# Patient Record
Sex: Male | Born: 1950 | Race: White | Hispanic: No | Marital: Married | Smoking: Former smoker
Health system: Southern US, Community
[De-identification: ages and names within clinical notes are randomized; demographics above are authoritative.]

## PROBLEM LIST (undated history)

## (undated) DIAGNOSIS — G4730 Sleep apnea, unspecified: Secondary | ICD-10-CM

## (undated) DIAGNOSIS — I10 Essential (primary) hypertension: Secondary | ICD-10-CM

## (undated) DIAGNOSIS — M5430 Sciatica, unspecified side: Secondary | ICD-10-CM

## (undated) DIAGNOSIS — I509 Heart failure, unspecified: Secondary | ICD-10-CM

## (undated) DIAGNOSIS — J811 Chronic pulmonary edema: Secondary | ICD-10-CM

## (undated) HISTORY — PX: REPLACEMENT TOTAL KNEE BILATERAL: SUR1225

## (undated) HISTORY — PX: CERVICAL LAMINECTOMY: SHX94

## (undated) HISTORY — PX: ORIF RADIUS & ULNA FRACTURES: SHX2129

## (undated) HISTORY — PX: CHOLECYSTECTOMY: SHX55

## (undated) HISTORY — PX: LUMBAR FUSION: SHX111

## (undated) HISTORY — PX: CORONARY ANGIOPLASTY WITH STENT PLACEMENT: SHX49

---

## 2010-07-21 ENCOUNTER — Encounter
Admission: RE | Admit: 2010-07-21 | Discharge: 2010-07-21 | Payer: Self-pay | Source: Home / Self Care | Attending: Neurosurgery | Admitting: Neurosurgery

## 2013-01-26 ENCOUNTER — Other Ambulatory Visit: Payer: Self-pay | Admitting: Specialist

## 2013-01-26 DIAGNOSIS — M545 Low back pain, unspecified: Secondary | ICD-10-CM

## 2013-02-01 NOTE — Progress Notes (Signed)
Blood obtained for labs for MR from right [facility] space without difficulty; site unremarkable.  Victory Dakin, RN

## 2013-02-03 ENCOUNTER — Ambulatory Visit
Admission: RE | Admit: 2013-02-03 | Discharge: 2013-02-03 | Disposition: A | Discharge status: Home / Self Care | Payer: 59 | Source: Ambulatory Visit | Attending: Specialist | Admitting: Specialist

## 2013-02-03 DIAGNOSIS — M545 Low back pain, unspecified: Secondary | ICD-10-CM

## 2013-02-03 MED ORDER — GADOBENATE DIMEGLUMINE 529 MG/ML IV SOLN
20.0000 mL | Freq: Once | INTRAVENOUS | Status: AC | PRN
  Administered 2013-02-03: 20 mL via INTRAVENOUS

## 2014-06-12 ENCOUNTER — Other Ambulatory Visit: Payer: Self-pay | Admitting: Specialist

## 2014-06-12 DIAGNOSIS — M5416 Radiculopathy, lumbar region: Secondary | ICD-10-CM

## 2014-06-13 ENCOUNTER — Ambulatory Visit
Admission: RE | Admit: 2014-06-13 | Discharge: 2014-06-13 | Disposition: A | Discharge status: Home / Self Care | Payer: 59 | Source: Ambulatory Visit | Attending: Specialist | Admitting: Specialist

## 2014-06-13 DIAGNOSIS — M5416 Radiculopathy, lumbar region: Secondary | ICD-10-CM

## 2014-06-13 MED ORDER — GADOBENATE DIMEGLUMINE 529 MG/ML IV SOLN
20.0000 mL | Freq: Once | INTRAVENOUS | Status: AC | PRN
  Administered 2014-06-13: 20 mL via INTRAVENOUS

## 2016-06-21 IMAGING — MR MR LUMBAR SPINE WO/W CM
4 of 8 series · 26 of 48 positions shown · IV contrast (20ml Multihance)
Comparison: 02/03/2013

CLINICAL DATA: Low back and bilateral leg pain and weakness.
Numbness in left leg. History of 3 prior surgeries and recent
epidural injections.

EXAM:
MRI LUMBAR SPINE WITHOUT AND WITH CONTRAST
TECHNIQUE: Multiplanar and multiecho pulse sequences of the lumbar spine were
obtained without and with intravenous contrast.
CONTRAST:  20mL MULTIHANCE GADOBENATE DIMEGLUMINE 529 MG/ML IV SOLN

[Series 4: T1 · sagittal · 4.0mm · 0.55mm/px · 4 of 13 slices shown (1 of 2)]
[im 1/13]
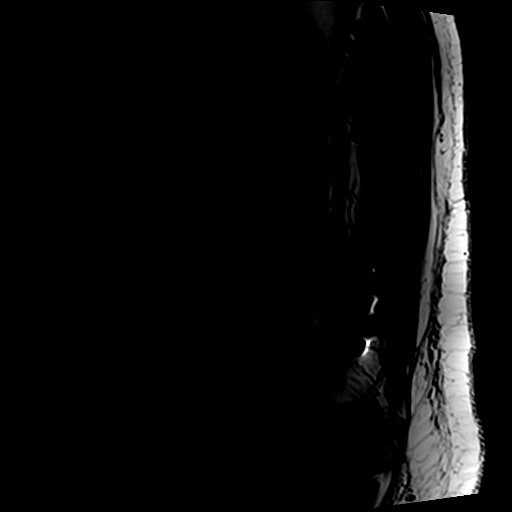
[im 5/13]
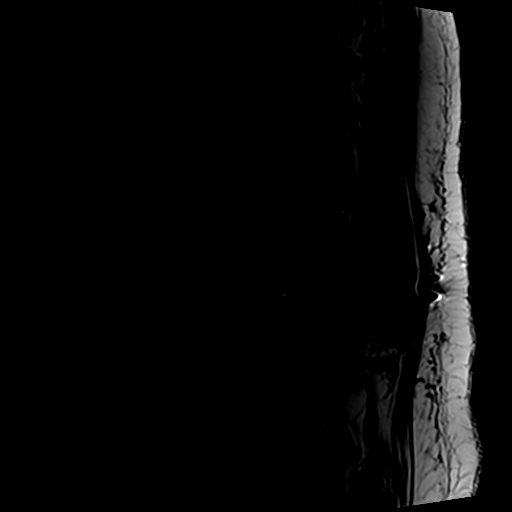
[im 9/13]
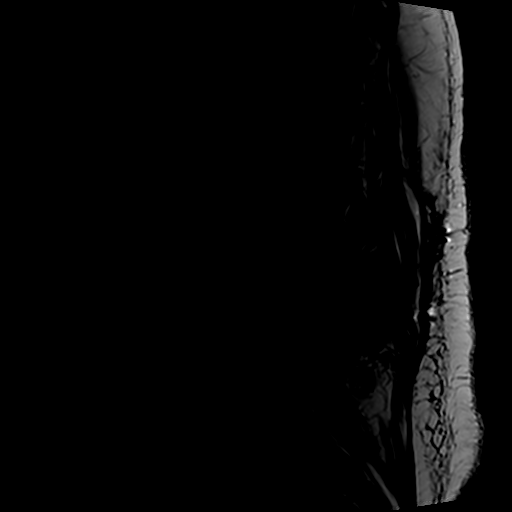
[im 13/13]
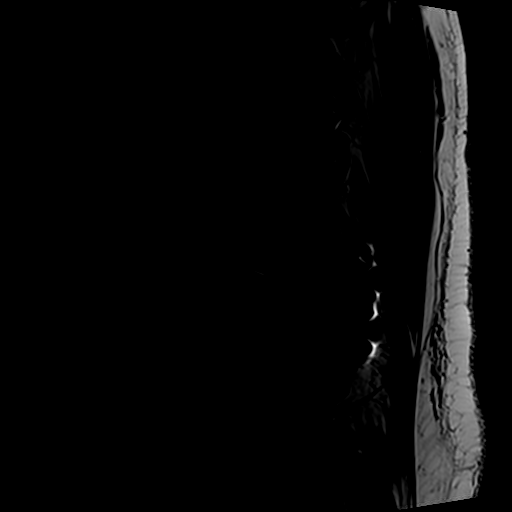

[Series 5: T2 · axial · 4.0mm · 0.70mm/px · z∈[-35,+139]mm · 9 of 31 slices shown]
[im 1/31]
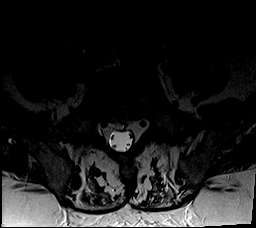
[im 4/31]
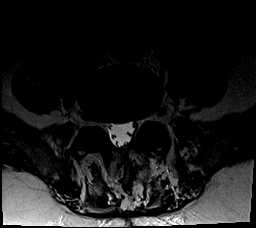
[im 8/31]
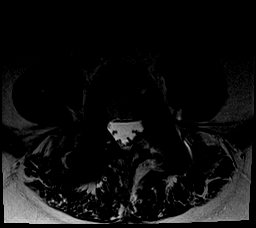
[im 12/31]
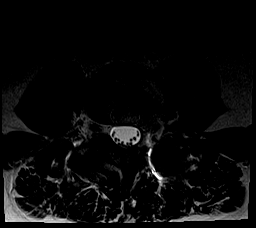
[im 16/31]
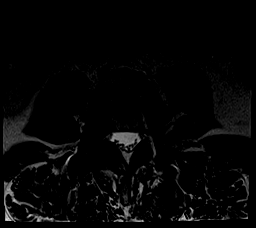
[im 19/31]
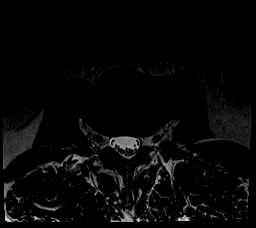
[im 23/31]
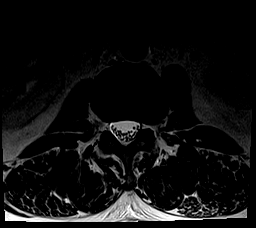
[im 27/31]
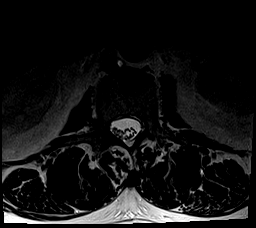
[im 31/31]
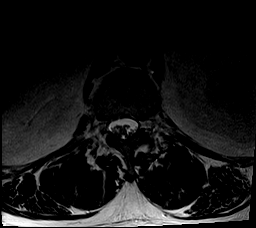

[Series 6: T1 · axial · 4.0mm · 0.35mm/px · z∈[-35,+139]mm · 9 of 31 slices shown (2 of 2)]
[im 1/31]
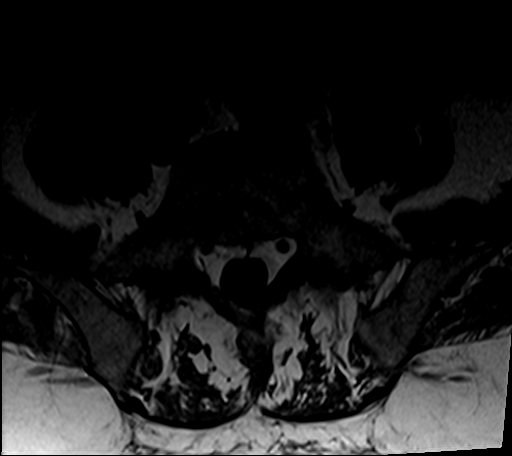
[im 4/31]
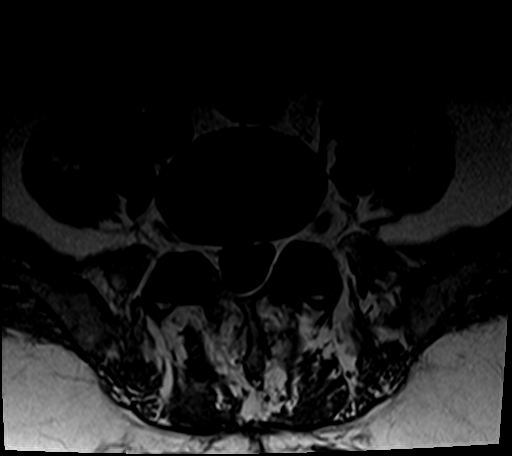
[im 8/31]
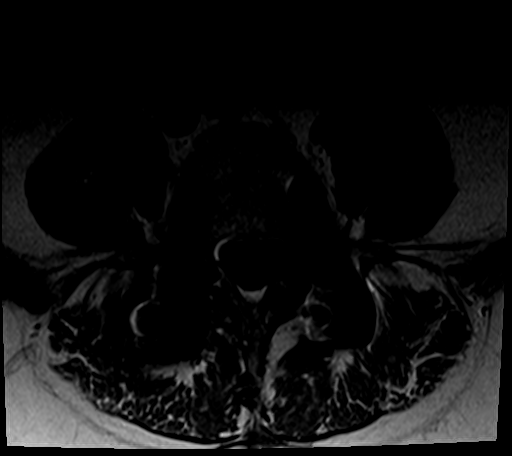
[im 12/31]
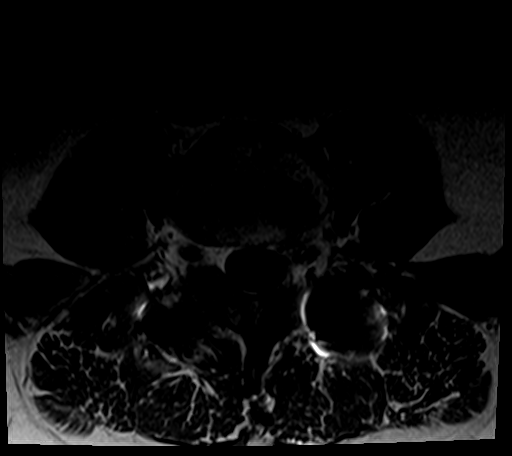
[im 16/31]
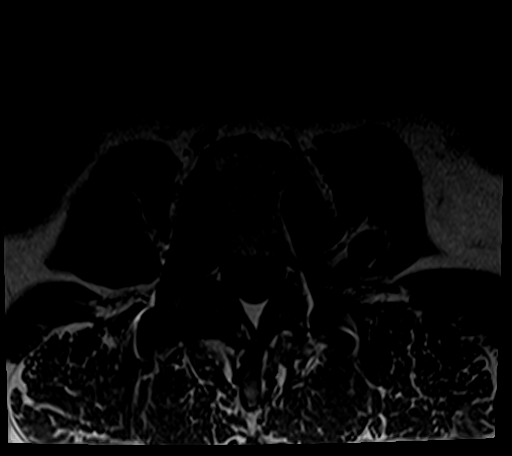
[im 19/31]
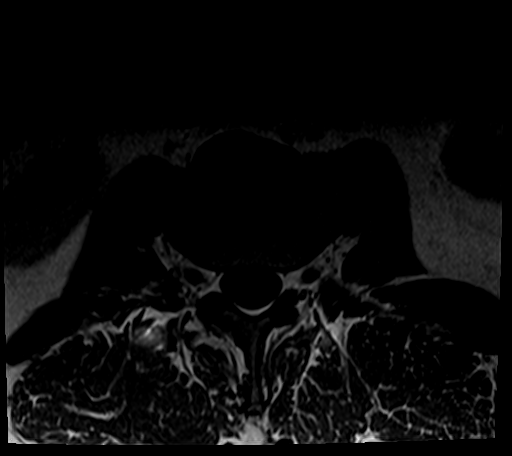
[im 23/31]
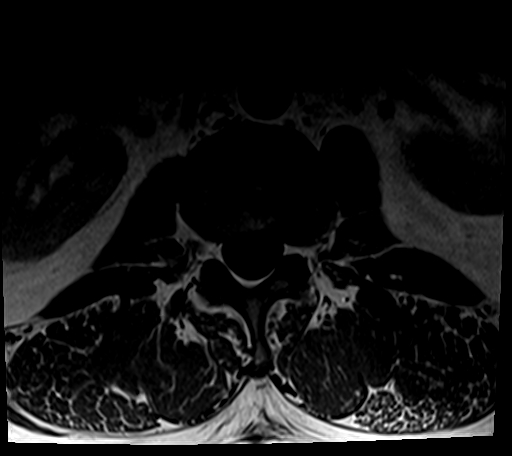
[im 27/31]
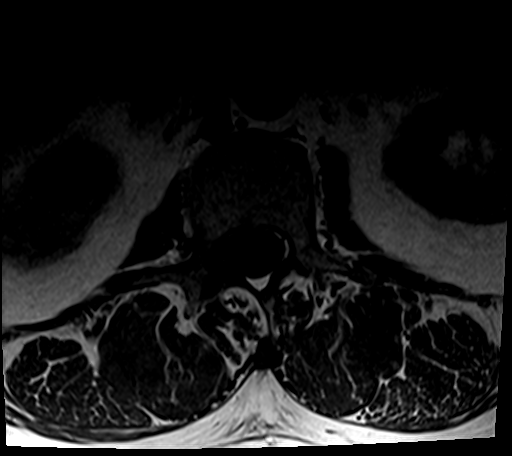
[im 31/31]
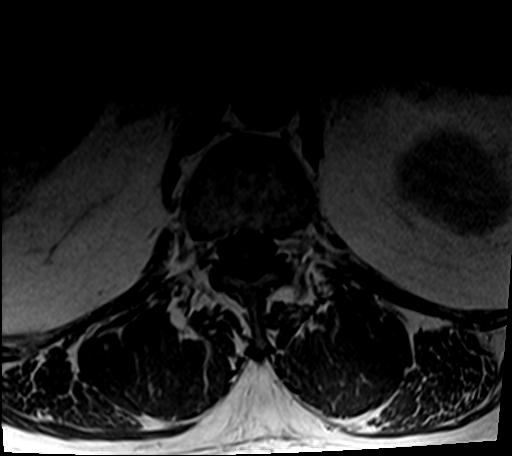

[Series 7: T2 post-contrast · sagittal · 4.0mm · 0.55mm/px · 4 of 13 slices shown]
[im 1/13]
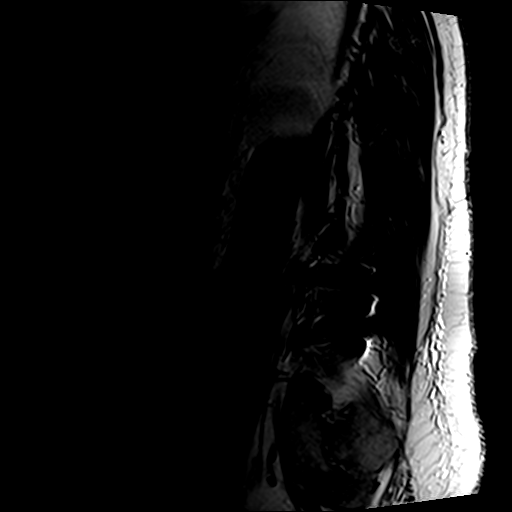
[im 5/13]
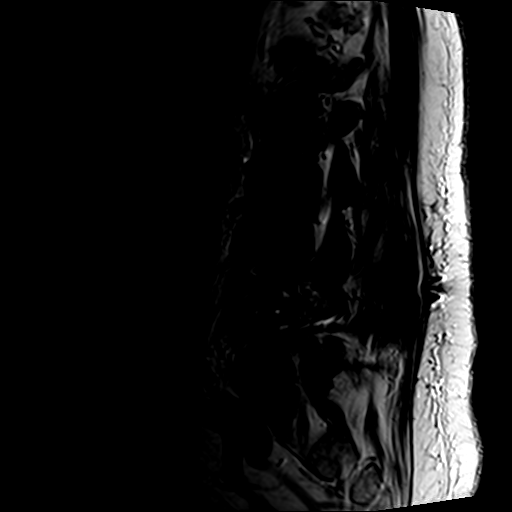
[im 9/13]
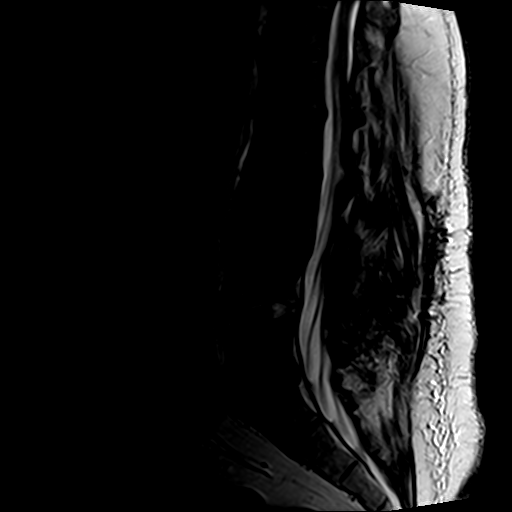
[im 13/13]
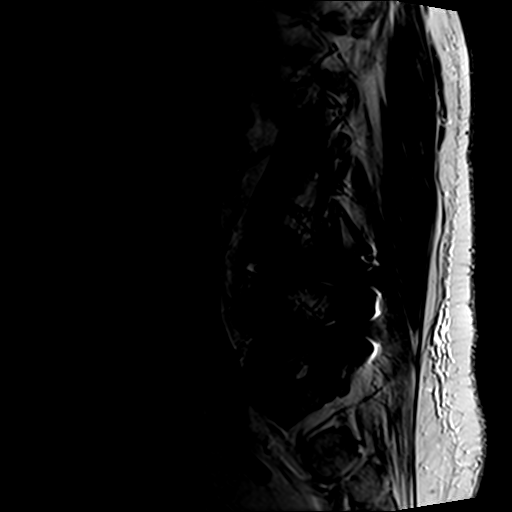

[26 of 48 positions shown; findings below may reference images not displayed]

FINDINGS: Normal and stable alignment of the lumbar vertebral bodies. They
demonstrate normal marrow signal. Stable endplate reactive changes
at L4-5. The conus medullaris terminates at L1-2. Stable surgical
changes at L4-5. No complicating features are demonstrated. Moderate
stable artifact.

L1-2: Stable very small right paracentral disc protrusion. No spinal
or foraminal stenosis.

L2-3: Shallow broad-based extra foraminal disc protrusion on the
left appears stable. There is mild displacement of the left L2 nerve
root.

L3-4: Diffuse annular bulge and mild facet disease. No spinal or
foraminal stenosis.

L4-5: Stable postoperative changes with posterior and interbody
fusion. Moderate artifact on the left, somewhat obscuring the left
neural foramen but I do not see any obvious spinal or foraminal
stenosis.

L5-S1:  No significant findings.  Moderate facet disease.
IMPRESSION: 1. Stable surgical changes at L4-5 without complicating features.
2. Stable very small right paracentral disc protrusion at L1-2.
3. Stable shallow broad-based extra foraminal disc protrusion on the
left at L2-3 mildly displacing the left L2 nerve root.

## 2021-04-12 ENCOUNTER — Emergency Department (HOSPITAL_BASED_OUTPATIENT_CLINIC_OR_DEPARTMENT_OTHER)
Admission: EM | Admit: 2021-04-12 | Discharge: 2021-04-12 | Disposition: A | Discharge status: Home / Self Care | Payer: Medicare HMO | Attending: Emergency Medicine | Admitting: Emergency Medicine

## 2021-04-12 ENCOUNTER — Other Ambulatory Visit: Payer: Self-pay

## 2021-04-12 ENCOUNTER — Encounter (HOSPITAL_BASED_OUTPATIENT_CLINIC_OR_DEPARTMENT_OTHER): Payer: Self-pay | Admitting: Emergency Medicine

## 2021-04-12 DIAGNOSIS — M7989 Other specified soft tissue disorders: Secondary | ICD-10-CM | POA: Diagnosis present

## 2021-04-12 DIAGNOSIS — Z87891 Personal history of nicotine dependence: Secondary | ICD-10-CM | POA: Insufficient documentation

## 2021-04-12 DIAGNOSIS — Z96653 Presence of artificial knee joint, bilateral: Secondary | ICD-10-CM | POA: Insufficient documentation

## 2021-04-12 DIAGNOSIS — I10 Essential (primary) hypertension: Secondary | ICD-10-CM | POA: Diagnosis not present

## 2021-04-12 DIAGNOSIS — L03115 Cellulitis of right lower limb: Secondary | ICD-10-CM

## 2021-04-12 HISTORY — DX: Heart failure, unspecified: I50.9

## 2021-04-12 HISTORY — DX: Chronic pulmonary edema: J81.1

## 2021-04-12 HISTORY — DX: Sciatica, unspecified side: M54.30

## 2021-04-12 HISTORY — DX: Essential (primary) hypertension: I10

## 2021-04-12 HISTORY — DX: Sleep apnea, unspecified: G47.30

## 2021-04-12 MED ORDER — DOXYCYCLINE HYCLATE 100 MG PO CAPS: 100.0000 mg | ORAL_CAPSULE | Freq: Two times a day (BID) | ORAL | 0 refills | Status: AC

## 2021-04-12 NOTE — ED Provider Notes (Signed)
[facility] EMERGENCY DEPARTMENT Provider Note   CSN: [id number] Arrival date & time: 04/12/21  1732     History Chief Complaint  Patient presents with   Wound Infection    Keith Figueroa is a 70 y.o. male reports redness, swelling, heat on right leg since Tuesday of last week.  Patient has had issues with venous stasis of both of his lower legs, but reports that he has not had an infection previously.  Patient has not noticed purulent drainage from the leg.  Patient reports that he has had this before, currently on Plavix and aspirin for anticoagulation.  Patient denies history of diabetes.  HPI     Past Medical History:  Diagnosis Date   CHF (congestive heart failure) (HCC)    Hypertension    Pulmonary edema    Sciatica    Sleep apnea     There are no problems to display for this patient.   Past Surgical History:  Procedure Laterality Date   CERVICAL LAMINECTOMY     CHOLECYSTECTOMY     CORONARY ANGIOPLASTY WITH STENT PLACEMENT     LUMBAR FUSION     ORIF RADIUS & ULNA FRACTURES     REPLACEMENT TOTAL KNEE BILATERAL         No family history on file.  Social History   Tobacco Use   Smoking status: Former    Types: Cigarettes   Smokeless tobacco: Never  Substance Use Topics   Alcohol use: Yes   Drug use: Not Currently    Home Medications Prior to Admission medications   Medication Sig Start Date End Date Taking? Authorizing Provider  doxycycline (VIBRAMYCIN) 100 MG capsule Take 1 capsule (100 mg total) by mouth 2 (two) times daily. 04/12/21  Yes Jamisen Duerson H, PA-C    Allergies    Amlodipine, Latex, and Codeine  Review of Systems   Review of Systems  Skin:  Positive for color change and wound.   Physical Exam Updated Vital Signs BP (!) 180/88   Pulse 61   Temp 97.8 F (36.6 C) (Oral)   Resp 18   Ht 5\' 11"  (1.803 m)   Wt 124.7 kg   SpO2 96%   BMI 38.35 kg/m   Physical Exam Vitals and nursing note reviewed.   Constitutional:      General: He is not in acute distress.    Appearance: Normal appearance.  HENT:     Head: Normocephalic and atraumatic.  Eyes:     General:        Right eye: No discharge.        Left eye: No discharge.  Cardiovascular:     Rate and Rhythm: Normal rate and regular rhythm.     Comments: DP PT pulses 2+ in affected right leg. Pulmonary:     Effort: Pulmonary effort is normal. No respiratory distress.  Musculoskeletal:        General: No deformity.     Comments: Strength 5 out of 5 in right and left legs.  Skin:    General: Skin is warm and dry.     Capillary Refill: Capillary refill takes less than 2 seconds.     Comments: Large area of poorly demarcated erythema, warmth, swelling on anterior surface of right lower leg.  Neurological:     Mental Status: He is alert and oriented to person, place, and time.     Comments: No sensory deficits in right leg  Psychiatric:  Mood and Affect: Mood normal.        Behavior: Behavior normal.      ED Results / Procedures / Treatments   Labs (all labs ordered are listed, but only abnormal results are displayed) Labs Reviewed - No data to display  EKG None  Radiology No results found.  Procedures Procedures   Medications Ordered in ED Medications - No data to display  ED Course  I have reviewed the triage vital signs and the nursing notes.  Pertinent labs & imaging results that were available during my care of the patient were reviewed by me and considered in my medical decision making (see chart for details).    MDM Rules/Calculators/A&P                         Appears to be simple cellulitis infection.  No tenderness to bony prominences, no issue with gait, no pain extending proximally or distally from edges of redness.  Neurovascularly intact.  No evidence of fluid collection, fluctuance.  Treatment with antibiotics and follow-up with primary care for resolution.  Return precautions discussed,  including fever, worsening of infection, failure to improve with antibiotics. Final Clinical Impression(s) / ED Diagnoses Final diagnoses:  Cellulitis of right lower extremity    Rx / DC Orders ED Discharge Orders          Ordered    doxycycline (VIBRAMYCIN) 100 MG capsule  2 times daily        04/12/21 1820             Montel Clock Chrisney, PA-C 04/12/21 1827    Arby Barrette, MD 04/15/21 [phone]

## 2021-04-12 NOTE — Discharge Instructions (Addendum)
Follow-up with your primary care as we discussed for resolution of your symptoms.  If you begin to have fever, or the infection worsens or fails to improve, please return for further evaluation.

## 2021-04-12 NOTE — ED Triage Notes (Signed)
Pt reports pain, swelling and redness to RLE; sts he hit it on something 1 wk ago
